# Patient Record
Sex: Male | Born: 1975 | Race: White | Hispanic: No | Marital: Married | Smoking: Current every day smoker
Health system: Southern US, Community
[De-identification: ages and names within clinical notes are randomized; demographics above are authoritative.]

## PROBLEM LIST (undated history)

## (undated) ENCOUNTER — Emergency Department: Discharge status: Absent without leave | Payer: Self-pay

## (undated) DIAGNOSIS — M549 Dorsalgia, unspecified: Secondary | ICD-10-CM

## (undated) DIAGNOSIS — G8929 Other chronic pain: Secondary | ICD-10-CM

## (undated) HISTORY — PX: TONSILLECTOMY: SUR1361

---

## 2009-11-15 ENCOUNTER — Emergency Department (HOSPITAL_COMMUNITY): Admission: EM | Admit: 2009-11-15 | Discharge: 2009-11-15 | Payer: Self-pay | Admitting: Emergency Medicine

## 2016-09-19 ENCOUNTER — Emergency Department (HOSPITAL_COMMUNITY)
Admission: EM | Admit: 2016-09-19 | Discharge: 2016-09-19 | Disposition: A | Discharge status: Home / Self Care | Payer: Self-pay | Attending: Emergency Medicine | Admitting: Emergency Medicine

## 2016-09-19 ENCOUNTER — Emergency Department (HOSPITAL_COMMUNITY): Payer: Self-pay

## 2016-09-19 ENCOUNTER — Encounter (HOSPITAL_COMMUNITY): Payer: Self-pay | Admitting: *Deleted

## 2016-09-19 DIAGNOSIS — M5442 Lumbago with sciatica, left side: Secondary | ICD-10-CM | POA: Insufficient documentation

## 2016-09-19 DIAGNOSIS — F17210 Nicotine dependence, cigarettes, uncomplicated: Secondary | ICD-10-CM | POA: Insufficient documentation

## 2016-09-19 HISTORY — DX: Dorsalgia, unspecified: M54.9

## 2016-09-19 HISTORY — DX: Other chronic pain: G89.29

## 2016-09-19 MED ORDER — OXYCODONE-ACETAMINOPHEN 5-325 MG PO TABS: 1.0000 | ORAL_TABLET | ORAL | 0 refills | Status: DC | PRN

## 2016-09-19 MED ORDER — NAPROXEN 500 MG PO TABS: 500.0000 mg | ORAL_TABLET | Freq: Two times a day (BID) | ORAL | 0 refills | Status: DC

## 2016-09-19 MED ORDER — PREDNISONE 10 MG PO TABS
20.0000 mg | ORAL_TABLET | Freq: Every day | ORAL | 0 refills | Status: DC
Start: 2016-09-19 — End: 2016-12-11

## 2016-09-19 MED ORDER — HYDROMORPHONE HCL 1 MG/ML IJ SOLN
1.0000 mg | Freq: Once | INTRAMUSCULAR | Status: AC
  Administered 2016-09-19: 1 mg via INTRAMUSCULAR
  Filled 2016-09-19: qty 1

## 2016-09-19 NOTE — ED Notes (Signed)
Patient transported to X-ray 

## 2016-09-19 NOTE — ED Provider Notes (Signed)
[facility]-EM[facility]Y DEPT Provider Note   CSN: [id number] Arrival date & time: 09/19/16  0901  By signing my name below, I, Bing Neighbors., attest that this documentation has been prepared under the direction and in the presence of No att. providers found. Electronically signed: Bing Neighbors., ED Scribe. 09/19/16. 2:58 PM.   History   Chief Complaint Chief Complaint  Patient presents with  . Back Pain    HPI BOWMAN HIGBIE is a 41 y.o. male who presents to the Emergency Department complaining of mild L lower back pain with onset x2 months. Pt states that he has had L lower back pain that radiates down the L leg to the heel for the past x2 months. He describes the pain as cramping. Pt denies any modifying factors. Pt states that lying down and position changes exacerbate the pain. Pt denies any recent heavy lifting. Of note, pt states that x2 months ago while chopping wood, he injured his R leg causing him to favor his L leg. He states that he has had gradually worsening L lower back pain since this incident.   The history is provided by the patient. No language interpreter was used.    Past Medical History:  Diagnosis Date  . Chronic back pain     There are no active problems to display for this patient.   Past Surgical History:  Procedure Laterality Date  . TONSILLECTOMY         Home Medications    Prior to Admission medications   Medication Sig Start Date End Date Taking? Authorizing Provider  Aspirin-Acetaminophen-Caffeine (GOODY HEADACHE PO) Take 1 packet by mouth daily as needed (pain).   Yes Historical Provider, MD  naproxen (NAPROSYN) 500 MG tablet Take 1 tablet (500 mg total) by mouth 2 (two) times daily. 09/19/16   Donnetta Hutching, MD  oxyCODONE-acetaminophen (PERCOCET) 5-325 MG tablet Take 1-2 tablets by mouth every 4 (four) hours as needed. 09/19/16   Donnetta Hutching, MD  predniSONE (DELTASONE) 10 MG tablet Take 2 tablets (20 mg total) by mouth daily.  09/19/16   Donnetta Hutching, MD    Family History History reviewed. No pertinent family history.  Social History Social History  Substance Use Topics  . Smoking status: Current Every Day Smoker    Packs/day: 1.00    Types: Cigarettes  . Smokeless tobacco: Never Used  . Alcohol use Yes     Comment: occasionally     Allergies   Patient has no known allergies.   Review of Systems Review of Systems  All other systems reviewed and are negative.    Physical Exam Updated Vital Signs BP 130/70   Pulse (!) 53   Temp 97.8 F (36.6 C) (Oral)   Resp 18   Ht  (1.803 m)   Wt 162 lb (73.5 kg)   SpO2 100%   BMI 22.59 kg/m   Physical Exam  Constitutional: He is oriented to person, place, and time. He appears well-developed and well-nourished.  HENT:  Head: Normocephalic and atraumatic.  Eyes: Conjunctivae are normal.  Neck: Neck supple.  Cardiovascular: Normal rate and regular rhythm.   Pulmonary/Chest: Effort normal and breath sounds normal.  Abdominal: Soft. Bowel sounds are normal.  Musculoskeletal: Normal range of motion.       Lumbar back: He exhibits tenderness.  Neurological: He is alert and oriented to person, place, and time.  Skin: Skin is warm and dry.  Psychiatric: He has a normal mood and affect. His  behavior is normal.  Nursing note and vitals reviewed.    ED Treatments / Results   DIAGNOSTIC STUDIES: Oxygen Saturation is 100% on RA, normal by my interpretation.   COORDINATION OF CARE: 2:58 PM-Discussed next steps with pt. Pt verbalized understanding and is agreeable with the plan.    Labs (all labs ordered are listed, but only abnormal results are displayed) Labs Reviewed - No data to display  EKG  EKG Interpretation None       Radiology Dg Lumbar Spine Complete  Result Date: 09/19/2016 CLINICAL DATA:  Low back pain extending into the left lower extremity. Pain began while cutting wood 3 months ago. EXAM: LUMBAR SPINE - COMPLETE 4+ VIEW  COMPARISON:  None. FINDINGS: Five non rib-bearing lumbar type vertebral bodies are present. The vertebral body heights and alignment are normal. Soft tissues are within normal limits. IMPRESSION: Negative lumbar spine radiographs. Electronically Signed   By: Marin Roberts M.D.   On: 09/19/2016 10:39    Procedures Procedures (including critical care time)  Medications Ordered in ED Medications  HYDROmorphone (DILAUDID) injection 1 mg (1 mg Intramuscular Given 09/19/16 1016)     Initial Impression / Assessment and Plan / ED Course  I have reviewed the triage vital signs and the nursing notes.  The plan is the get an X-ray of the lumbar spine and prescribe pt prednisone for pain management. If this continues pt will need an MRI.  Pertinent labs & imaging results that were available during my care of the patient were reviewed by me and considered in my medical decision making (see chart for details).     History and physical most consistent with left-sided sciatica pain.  We discussed the possibility of a herniated nucleus pulposus and the need for an MRI in the future [if symptoms do not improve].  Discharge medications prednisone, Percocet, Naprosyn 500 mg  Final Clinical Impressions(s) / ED Diagnoses   Final diagnoses:  Acute left-sided low back pain with left-sided sciatica    New Prescriptions Discharge Medication List as of 09/19/2016 12:59 PM    START taking these medications   Details  naproxen (NAPROSYN) 500 MG tablet Take 1 tablet (500 mg total) by mouth 2 (two) times daily., Starting Fri 09/19/2016, Print    oxyCODONE-acetaminophen (PERCOCET) 5-325 MG tablet Take 1-2 tablets by mouth every 4 (four) hours as needed., Starting Fri 09/19/2016, Print    predniSONE (DELTASONE) 10 MG tablet Take 2 tablets (20 mg total) by mouth daily., Starting Fri 09/19/2016, Print       I personally performed the services described in this documentation, which was scribed in my  presence. The recorded information has been reviewed and is accurate.      Donnetta Hutching, MD 09/19/16 1459

## 2016-09-19 NOTE — ED Notes (Signed)
Patient with no complaints at this time. Respirations even and unlabored. Skin warm/dry. Discharge instructions reviewed with patient at this time. Patient given opportunity to voice concerns/ask questions. Patient discharged at this time and left Emergency Department with steady gait.   

## 2016-09-19 NOTE — ED Notes (Signed)
Patient returned from xray.

## 2016-09-19 NOTE — ED Triage Notes (Signed)
Pt c/o lower back pain for the last week; pt states he has numbness and tingling down left leg; pt denies any recent injury, states he hurt it in the past

## 2016-09-19 NOTE — Discharge Instructions (Signed)
X-ray shows no obvious fractures. If symptoms persist, you will need an MRI of your lower spine. Prescription for anti-inflammatory medicine, pain medicine, prednisone. Try to find a primary care doctor.

## 2016-09-28 ENCOUNTER — Encounter (HOSPITAL_COMMUNITY): Payer: Self-pay | Admitting: *Deleted

## 2016-09-28 ENCOUNTER — Emergency Department (HOSPITAL_COMMUNITY)
Admission: EM | Admit: 2016-09-28 | Discharge: 2016-09-28 | Disposition: A | Discharge status: Home / Self Care | Payer: Self-pay | Attending: Emergency Medicine | Admitting: Emergency Medicine

## 2016-09-28 DIAGNOSIS — Z7982 Long term (current) use of aspirin: Secondary | ICD-10-CM | POA: Insufficient documentation

## 2016-09-28 DIAGNOSIS — M5442 Lumbago with sciatica, left side: Secondary | ICD-10-CM | POA: Insufficient documentation

## 2016-09-28 DIAGNOSIS — F17210 Nicotine dependence, cigarettes, uncomplicated: Secondary | ICD-10-CM | POA: Insufficient documentation

## 2016-09-28 DIAGNOSIS — M5432 Sciatica, left side: Secondary | ICD-10-CM

## 2016-09-28 MED ORDER — ONDANSETRON 8 MG PO TBDP
8.0000 mg | ORAL_TABLET | Freq: Once | ORAL | Status: AC
  Administered 2016-09-28: 8 mg via ORAL
  Filled 2016-09-28: qty 1

## 2016-09-28 MED ORDER — CYCLOBENZAPRINE HCL 5 MG PO TABS: 5.0000 mg | ORAL_TABLET | Freq: Three times a day (TID) | ORAL | 0 refills | Status: DC | PRN

## 2016-09-28 MED ORDER — OXYCODONE-ACETAMINOPHEN 5-325 MG PO TABS: 1.0000 | ORAL_TABLET | ORAL | 0 refills | Status: DC | PRN

## 2016-09-28 MED ORDER — DICLOFENAC SODIUM 75 MG PO TBEC: 75.0000 mg | DELAYED_RELEASE_TABLET | Freq: Two times a day (BID) | ORAL | 0 refills | Status: DC

## 2016-09-28 MED ORDER — HYDROMORPHONE HCL 1 MG/ML IJ SOLN
1.0000 mg | Freq: Once | INTRAMUSCULAR | Status: AC
  Administered 2016-09-28: 1 mg via INTRAMUSCULAR
  Filled 2016-09-28: qty 1

## 2016-09-28 NOTE — ED Triage Notes (Signed)
Pt comes in with lower back pain. Pt was seen here for this last week. Pain continues and moves down his left leg.

## 2016-09-28 NOTE — ED Provider Notes (Signed)
[facility] DE[facility] Provider Note   CSN: [id number] Arrival date & time: 09/28/16  1502  By signing my name below, I, Diona Browner, attest that this documentation has been prepared under the direction and in the presence of Burgess Amor, PA-C. Electronically Signed: Diona Browner, ED Scribe. 09/28/16. 3:59 PM.  History   Chief Complaint Chief Complaint  Patient presents with  . Back Pain    HPI Ricardo Graves is a 41 y.o. male with a PMHx of chronic back pain, who presents to the Emergency Department complaining of gradually worsening, radiating lower back pain for the last two months. Pt reports pain radiates down his left leg to his heel. Was seen on 09/19/16 for similar onset and was given medication that ran out yesterday. He has an appointment with a neurology specialist next week. When he wakes up it takes him ~ 30 minutes to fully stand up, and then he is able to walk around. Moving around alleviates his pain, while sitting exacerbates his pain. 7 years ago he originally hurt his back while moving a tv and then the beginning of the year he reinjured his back while cutting wood. Pt denies weakness in his legs and any urinary issues.   The history is provided by the patient. No language interpreter was used.    Past Medical History:  Diagnosis Date  . Chronic back pain     There are no active problems to display for this patient.   Past Surgical History:  Procedure Laterality Date  . TONSILLECTOMY         Home Medications    Prior to Admission medications   Medication Sig Start Date End Date Taking? Authorizing Provider  Aspirin-Acetaminophen-Caffeine (GOODY HEADACHE PO) Take 1 packet by mouth daily as needed (pain).    Historical Provider, MD  cyclobenzaprine (FLEXERIL) 5 MG tablet Take 1 tablet (5 mg total) by mouth 3 (three) times daily as needed for muscle spasms. 09/28/16   Burgess Amor, PA-C  diclofenac (VOLTAREN) 75 MG EC tablet Take 1 tablet (75 mg total) by  mouth 2 (two) times daily. 09/28/16   Burgess Amor, PA-C  naproxen (NAPROSYN) 500 MG tablet Take 1 tablet (500 mg total) by mouth 2 (two) times daily. 09/19/16   Donnetta Hutching, MD  oxyCODONE-acetaminophen (PERCOCET/ROXICET) 5-325 MG tablet Take 1 tablet by mouth every 4 (four) hours as needed. 09/28/16   Burgess Amor, PA-C  predniSONE (DELTASONE) 10 MG tablet Take 2 tablets (20 mg total) by mouth daily. 09/19/16   Donnetta Hutching, MD    Family History No family history on file.  Social History Social History  Substance Use Topics  . Smoking status: Current Every Day Smoker    Packs/day: 1.00    Types: Cigarettes  . Smokeless tobacco: Never Used  . Alcohol use Yes     Comment: occasionally     Allergies   Patient has no known allergies.   Review of Systems Review of Systems  Genitourinary: Negative for difficulty urinating, dysuria, frequency, hematuria and urgency.  Musculoskeletal: Positive for back pain.  Neurological: Negative for weakness.     Physical Exam Updated Vital Signs BP (!) 148/100 (BP Location: Right Arm)   Pulse 88   Temp 97.4 F (36.3 C) (Temporal)   Resp 18   Ht  (1.803 m)   Wt 73.5 kg   SpO2 100%   BMI 22.59 kg/m   Physical Exam  Constitutional: He appears well-developed and well-nourished.  HENT:  Head: Normocephalic.  Eyes: Conjunctivae are normal.  Neck: Normal range of motion. Neck supple.  Cardiovascular: Normal rate and intact distal pulses.   Pedal pulses normal.  Pulmonary/Chest: Effort normal.  Abdominal: Soft. Bowel sounds are normal. He exhibits no distension and no mass.  Musculoskeletal: Normal range of motion. He exhibits no edema.       Lumbar back: He exhibits tenderness. He exhibits no swelling, no edema and no spasm.  Pain present across lower left paralumbar soft tissue, not worsened with palpation.  Sciatic notch ttp.  Neurological: He is alert. He has normal strength. He displays no atrophy and no tremor. No sensory deficit.  Gait normal.  Reflex Scores:      Patellar reflexes are 2+ on the right side and 2+ on the left side.      Achilles reflexes are 2+ on the right side and 2+ on the left side. No strength deficit noted in hip and knee flexor and extensor muscle groups.  Ankle flexion and extension intact.  Skin: Skin is warm and dry.  Psychiatric: He has a normal mood and affect.  Nursing note and vitals reviewed.    ED Treatments / Results  DIAGNOSTIC STUDIES: Oxygen Saturation is 100% on RA, normal by my interpretation.   COORDINATION OF CARE: 3:59 PM-Discussed next steps with pt. Pt verbalized understanding and is agreeable with the plan.    Labs (all labs ordered are listed, but only abnormal results are displayed) Labs Reviewed - No data to display  EKG  EKG Interpretation None       Radiology No results found.  Procedures Procedures (including critical care time)  Medications Ordered in ED Medications  HYDROmorphone (DILAUDID) injection 1 mg (1 mg Intramuscular Given 09/28/16 1633)  ondansetron (ZOFRAN-ODT) disintegrating tablet 8 mg (8 mg Oral Given 09/28/16 1633)     Initial Impression / Assessment and Plan / ED Course  I have reviewed the triage vital signs and the nursing notes.  Pertinent labs & imaging results that were available during my care of the patient were reviewed by me and considered in my medical decision making (see chart for details).     No neuro deficit on exam or by history to suggest emergent or surgical presentation.  Also discussed worsened sx that should prompt immediate re-evaluation including distal weakness, bowel/bladder retention/incontinence.  Pt has appt with neurologist next week, encouraged to keep this appt. Also given referral for primary care.   Final Clinical Impressions(s) / ED Diagnoses   Final diagnoses:  Sciatica of left side    New Prescriptions New Prescriptions   CYCLOBENZAPRINE (FLEXERIL) 5 MG TABLET    Take 1 tablet (5  mg total) by mouth 3 (three) times daily as needed for muscle spasms.   DICLOFENAC (VOLTAREN) 75 MG EC TABLET    Take 1 tablet (75 mg total) by mouth 2 (two) times daily.   OXYCODONE-ACETAMINOPHEN (PERCOCET/ROXICET) 5-325 MG TABLET    Take 1 tablet by mouth every 4 (four) hours as needed.   I personally performed the services described in this documentation, which was scribed in my presence. The recorded information has been reviewed and is accurate.     Burgess Amor, PA-C 09/28/16 1658    Raeford Razor, MD 09/28/16 2329

## 2016-09-28 NOTE — Discharge Instructions (Signed)
Do not drive within 4 hours of taking oxycodone as this will make you drowsy.  Avoid lifting,  Bending,  Twisting or any other activity that worsens your pain over the next week.  Apply a heating pad to your back 20 minutes 2-3 times daily.  You should get rechecked if your symptoms are not improving or you develop increased pain,  Weakness in your leg(s) or loss of bladder or bowel function - these are symptoms of a worsening condition.

## 2016-09-29 DIAGNOSIS — Z139 Encounter for screening, unspecified: Secondary | ICD-10-CM

## 2016-09-29 LAB — GLUCOSE, POCT (MANUAL RESULT ENTRY): POC GLUCOSE: 102 mg/dL — AB (ref 70–99)

## 2016-09-29 NOTE — Congregational Nurse Program (Signed)
Congregational Nurse Program Note  Date of Encounter: 09/29/2016  Past Medical History: Past Medical History:  Diagnosis Date  . Chronic back pain     Encounter Details:     CNP Questionnaire - 09/29/16 1423      Patient Demographics   Is this a new or existing patient? New   Patient is considered a/an Not Applicable   Race Caucasian/White     Patient Assistance   Location of Patient Assistance [facility]   Patient's financial/insurance status Self-Pay (Uninsured)   Uninsured Patient ([place] [employer]/Care Connects) Yes   Interventions Counseled to make appt. with provider   Patient referred to apply for the following financial assistance [employer] or to a Navigator   Food insecurities addressed Not Applicable   Transportation assistance No   Assistance securing medications No   Educational health offerings Acute disease;Navigating the healthcare system;Safety     Encounter Details   Primary purpose of visit Education/Health Concerns;Acute Illness/Condition Visit;Navigating the Healthcare System;Safety;Post ED/Hospitalization Visit   Was an Emergency Department visit averted? Not Applicable   Does patient have a medical provider? No   Patient referred to Establish PCP;Private Practice   Was a mental health screening completed? (GAINS tool) No   Does patient have dental issues? No   Does patient have vision issues? No   Does your patient have an abnormal blood pressure today? No   Since previous encounter, have you referred patient for abnormal blood pressure that resulted in a new diagnosis or medication change? No   Does your patient have an abnormal blood glucose today? No   Since previous encounter, have you referred patient for abnormal blood glucose that resulted in a new diagnosis or medication change? No   Was there a life-saving intervention made? No     Client seen today after being seen in [facility] Emergency Room on 09/28/16 for a second time due to lower  back pain that radiates down his left leg with some tingling. He states her injured it this time in February cutting wood and that it has not gotten any better. He first injured his lower back per client about 7 years ago while moving a heavy TV set. Client states he has had plain x-rays in a previous Emergency room visit and states they referred him to get established with a medical provider. Client lives with family and works full time with [employer] and states he has worked with them the past few years. He shares that he intends on getting medical insurance next enrollment period in November and it will start in January, but he just hadn't done so in the past. Discussed options of "safety net providers" and that he would have do have financial screenings with those and what the guidelines would be. Client states he probably would make too much money.  Discussed option of a private local medical practice that is accepting new patients. Client would prefer to stay within [place] for his medical care. Client given contact information as well as a new patient application for [facility]. Discussed with client that establishing a primary medical provider is important to help guide his care and to use for wellness and preventative care as well as non emergent medical care. Client states understanding and states he will go by there. RN discussed that he will incur a copayment that would be paid out of pocket, but once established and he has medical insurance he would still be able to continue with that  practice more than likely depending on his insurance coverage. Client states he is willing to pay out of pocket at this time for help with his pain.  Alert and oriented to person and place, answers questions appropriately. Client describes pain worse with sitting to standing and also while lying down. Gait steady , denies any loss of strength in lower left leg. States he does have tingling  in his left leg off and on and some over left sciatic area. Denies any incontinence of bowel or bladder and denies trouble urinating. Client states he was going to call a neurologist in [place] but has not done so yet. He states he asked the hospital for an MRI but was told they could not order unless an emergency. Medications given in Emergency room to take as outpatient : Diclofenac one twice daily Cyclobenzaprine one 3 times daily Oxycondone 1 by mouth every 4 hours as needed for pain  Client reports that with taking these medications he is not taking "Goody Powders" RN discussed with client to take his medications with food or after eating a meal and to avoid any extra tylenol or NSAIDS such as ibuprofen while taking above medications. RN discussed techniques to ease back pain while in bed such as lying on side with one leg flexed and pillow under to support lower back, also discussed changing positions from lying to standing gradually as well as avoiding twisting and bending from waist without support. Client states understanding. Client states he will go by to inquire about becoming a medical patient at [facility]. RN gave Hyman Bower contact information and will follow up with client in  1 to 2 days to follow up if he connected with a medical provider. Clinic verbally acknowledged permission to call for follow up

## 2016-09-30 ENCOUNTER — Telehealth: Payer: Self-pay

## 2016-09-30 NOTE — Telephone Encounter (Signed)
Patient was here at [facility] 09/29/16 with pain. Upon intake it was discovered that he work full time at [employer].  Patient was encouraged to call the [facility] for new patient intake. Was also given a list of other MDs taking new patients.  Follow-up call was made to check on patient. Message was left to call Hyman Bower if he needed any more assistance.  Acsa Estey R. Tiran Sauseda LPN [phone]

## 2016-10-17 ENCOUNTER — Other Ambulatory Visit (HOSPITAL_COMMUNITY): Payer: Self-pay | Admitting: Internal Medicine

## 2016-10-17 DIAGNOSIS — M5432 Sciatica, left side: Secondary | ICD-10-CM

## 2016-10-29 ENCOUNTER — Ambulatory Visit (HOSPITAL_COMMUNITY)
Admission: RE | Admit: 2016-10-29 | Discharge: 2016-10-29 | Disposition: A | Discharge status: Home / Self Care | Payer: Medicaid Other | Source: Ambulatory Visit | Attending: Internal Medicine | Admitting: Internal Medicine

## 2016-10-29 DIAGNOSIS — M5127 Other intervertebral disc displacement, lumbosacral region: Secondary | ICD-10-CM | POA: Diagnosis not present

## 2016-10-29 DIAGNOSIS — M5432 Sciatica, left side: Secondary | ICD-10-CM

## 2016-12-02 ENCOUNTER — Other Ambulatory Visit: Payer: Self-pay | Admitting: Nurse Practitioner

## 2016-12-02 DIAGNOSIS — M5126 Other intervertebral disc displacement, lumbar region: Secondary | ICD-10-CM

## 2016-12-11 ENCOUNTER — Ambulatory Visit
Admission: RE | Admit: 2016-12-11 | Discharge: 2016-12-11 | Disposition: A | Discharge status: Home / Self Care | Payer: Self-pay | Source: Ambulatory Visit | Attending: Nurse Practitioner | Admitting: Nurse Practitioner

## 2016-12-11 DIAGNOSIS — M5126 Other intervertebral disc displacement, lumbar region: Secondary | ICD-10-CM

## 2016-12-11 MED ORDER — IOPAMIDOL (ISOVUE-M 200) INJECTION 41%
1.0000 mL | Freq: Once | INTRAMUSCULAR | Status: AC
  Administered 2016-12-11: 1 mL via EPIDURAL

## 2016-12-11 MED ORDER — METHYLPREDNISOLONE ACETATE 40 MG/ML INJ SUSP (RADIOLOG
120.0000 mg | Freq: Once | INTRAMUSCULAR | Status: AC
  Administered 2016-12-11: 120 mg via EPIDURAL

## 2016-12-11 NOTE — Discharge Instructions (Signed)

## 2016-12-19 ENCOUNTER — Other Ambulatory Visit: Payer: Self-pay | Admitting: Neurosurgery

## 2016-12-19 DIAGNOSIS — M5126 Other intervertebral disc displacement, lumbar region: Secondary | ICD-10-CM

## 2016-12-30 ENCOUNTER — Other Ambulatory Visit: Payer: Self-pay | Admitting: Neurosurgery

## 2016-12-30 ENCOUNTER — Ambulatory Visit
Admission: RE | Admit: 2016-12-30 | Discharge: 2016-12-30 | Disposition: A | Discharge status: Home / Self Care | Payer: Self-pay | Source: Ambulatory Visit | Attending: Neurosurgery | Admitting: Neurosurgery

## 2016-12-30 DIAGNOSIS — M5126 Other intervertebral disc displacement, lumbar region: Secondary | ICD-10-CM

## 2016-12-30 MED ORDER — METHYLPREDNISOLONE ACETATE 40 MG/ML INJ SUSP (RADIOLOG
120.0000 mg | Freq: Once | INTRAMUSCULAR | Status: AC
  Administered 2016-12-30: 120 mg via EPIDURAL

## 2016-12-30 MED ORDER — IOPAMIDOL (ISOVUE-M 200) INJECTION 41%
1.0000 mL | Freq: Once | INTRAMUSCULAR | Status: AC
  Administered 2016-12-30: 1 mL via EPIDURAL

## 2016-12-30 NOTE — Discharge Instructions (Signed)

## 2017-01-23 ENCOUNTER — Other Ambulatory Visit: Payer: Self-pay | Admitting: Nurse Practitioner

## 2017-01-23 DIAGNOSIS — M5126 Other intervertebral disc displacement, lumbar region: Secondary | ICD-10-CM

## 2017-02-04 ENCOUNTER — Ambulatory Visit
Admission: RE | Admit: 2017-02-04 | Discharge: 2017-02-04 | Disposition: A | Discharge status: Home / Self Care | Payer: Medicaid Other | Source: Ambulatory Visit | Attending: Nurse Practitioner | Admitting: Nurse Practitioner

## 2017-02-04 DIAGNOSIS — M5126 Other intervertebral disc displacement, lumbar region: Secondary | ICD-10-CM

## 2017-02-04 MED ORDER — METHYLPREDNISOLONE ACETATE 40 MG/ML INJ SUSP (RADIOLOG
120.0000 mg | Freq: Once | INTRAMUSCULAR | Status: AC
  Administered 2017-02-04: 120 mg via EPIDURAL

## 2017-02-04 MED ORDER — IOPAMIDOL (ISOVUE-M 200) INJECTION 41%
1.0000 mL | Freq: Once | INTRAMUSCULAR | Status: AC
  Administered 2017-02-04: 1 mL via EPIDURAL

## 2017-02-04 NOTE — Discharge Instructions (Signed)

## 2018-01-25 ENCOUNTER — Emergency Department (HOSPITAL_COMMUNITY)
Admission: EM | Admit: 2018-01-25 | Discharge: 2018-01-25 | Disposition: A | Discharge status: Home / Self Care | Payer: Medicaid Other | Attending: Emergency Medicine | Admitting: Emergency Medicine

## 2018-01-25 ENCOUNTER — Encounter (HOSPITAL_COMMUNITY): Payer: Self-pay | Admitting: Emergency Medicine

## 2018-01-25 DIAGNOSIS — Y999 Unspecified external cause status: Secondary | ICD-10-CM | POA: Insufficient documentation

## 2018-01-25 DIAGNOSIS — Z7982 Long term (current) use of aspirin: Secondary | ICD-10-CM | POA: Insufficient documentation

## 2018-01-25 DIAGNOSIS — Y939 Activity, unspecified: Secondary | ICD-10-CM | POA: Diagnosis not present

## 2018-01-25 DIAGNOSIS — T23291A Burn of second degree of multiple sites of right wrist and hand, initial encounter: Secondary | ICD-10-CM | POA: Diagnosis not present

## 2018-01-25 DIAGNOSIS — T22211A Burn of second degree of right forearm, initial encounter: Secondary | ICD-10-CM | POA: Insufficient documentation

## 2018-01-25 DIAGNOSIS — T2220XA Burn of second degree of shoulder and upper limb, except wrist and hand, unspecified site, initial encounter: Secondary | ICD-10-CM

## 2018-01-25 DIAGNOSIS — Y929 Unspecified place or not applicable: Secondary | ICD-10-CM | POA: Insufficient documentation

## 2018-01-25 DIAGNOSIS — F17210 Nicotine dependence, cigarettes, uncomplicated: Secondary | ICD-10-CM | POA: Insufficient documentation

## 2018-01-25 DIAGNOSIS — X17XXXA Contact with hot engines, machinery and tools, initial encounter: Secondary | ICD-10-CM | POA: Diagnosis not present

## 2018-01-25 DIAGNOSIS — T23201A Burn of second degree of right hand, unspecified site, initial encounter: Secondary | ICD-10-CM

## 2018-01-25 MED ORDER — IBUPROFEN 800 MG PO TABS
800.0000 mg | ORAL_TABLET | Freq: Once | ORAL | Status: AC
  Administered 2018-01-25: 800 mg via ORAL
  Filled 2018-01-25: qty 1

## 2018-01-25 MED ORDER — SILVER SULFADIAZINE 1 % EX CREA
TOPICAL_CREAM | Freq: Once | CUTANEOUS | Status: AC
  Administered 2018-01-25: 1 via TOPICAL
  Filled 2018-01-25: qty 50

## 2018-01-25 MED ORDER — SILVER SULFADIAZINE 1 % EX CREA: 1.0000 "application " | TOPICAL_CREAM | Freq: Every day | CUTANEOUS | 0 refills | Status: AC

## 2018-01-25 MED ORDER — CEFDINIR 300 MG PO CAPS: 300.0000 mg | ORAL_CAPSULE | Freq: Two times a day (BID) | ORAL | 0 refills | Status: AC

## 2018-01-25 MED ORDER — TETANUS-DIPHTH-ACELL PERTUSSIS 5-2.5-18.5 LF-MCG/0.5 IM SUSP
0.5000 mL | Freq: Once | INTRAMUSCULAR | Status: AC
  Administered 2018-01-25: 0.5 mL via INTRAMUSCULAR
  Filled 2018-01-25: qty 0.5

## 2018-01-25 MED ORDER — CEPHALEXIN 500 MG PO CAPS
500.0000 mg | ORAL_CAPSULE | Freq: Once | ORAL | Status: AC
  Administered 2018-01-25: 500 mg via ORAL
  Filled 2018-01-25: qty 1

## 2018-01-25 MED ORDER — IBUPROFEN 600 MG PO TABS: 600.0000 mg | ORAL_TABLET | Freq: Four times a day (QID) | ORAL | 0 refills | Status: AC

## 2018-01-25 MED ORDER — ONDANSETRON HCL 4 MG PO TABS
4.0000 mg | ORAL_TABLET | Freq: Once | ORAL | Status: AC
  Administered 2018-01-25: 4 mg via ORAL
  Filled 2018-01-25: qty 1

## 2018-01-25 MED ORDER — ACETAMINOPHEN 500 MG PO TABS
1000.0000 mg | ORAL_TABLET | Freq: Once | ORAL | Status: AC
  Administered 2018-01-25: 1000 mg via ORAL
  Filled 2018-01-25: qty 2

## 2018-01-25 NOTE — Discharge Instructions (Addendum)
Please cleanse the burn areas with soap and water.  Please do not use any peroxide.  Please code the areas with Silvadene daily.  Use a nonstick dressing to your arm and hand daily.  Please use 600 mg of ibuprofen, and 1000 mg of Tylenol with breakfast, lunch, dinner, and at bedtime for soreness.  Please use Omnicef 2 times daily to prevent infection.  Please see Dr. Selena BattenKim in the office for follow-up of your burn.
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
## 2018-07-26 IMAGING — DX DG LUMBAR SPINE COMPLETE 4+V
5 series · 5 of 5 positions shown · non-contrast
Comparison: None.

CLINICAL DATA: Low back pain extending into the left lower
extremity. Pain began while cutting wood 3 months ago.

EXAM:
LUMBAR SPINE - COMPLETE 4+ VIEW

[l-spine ap]
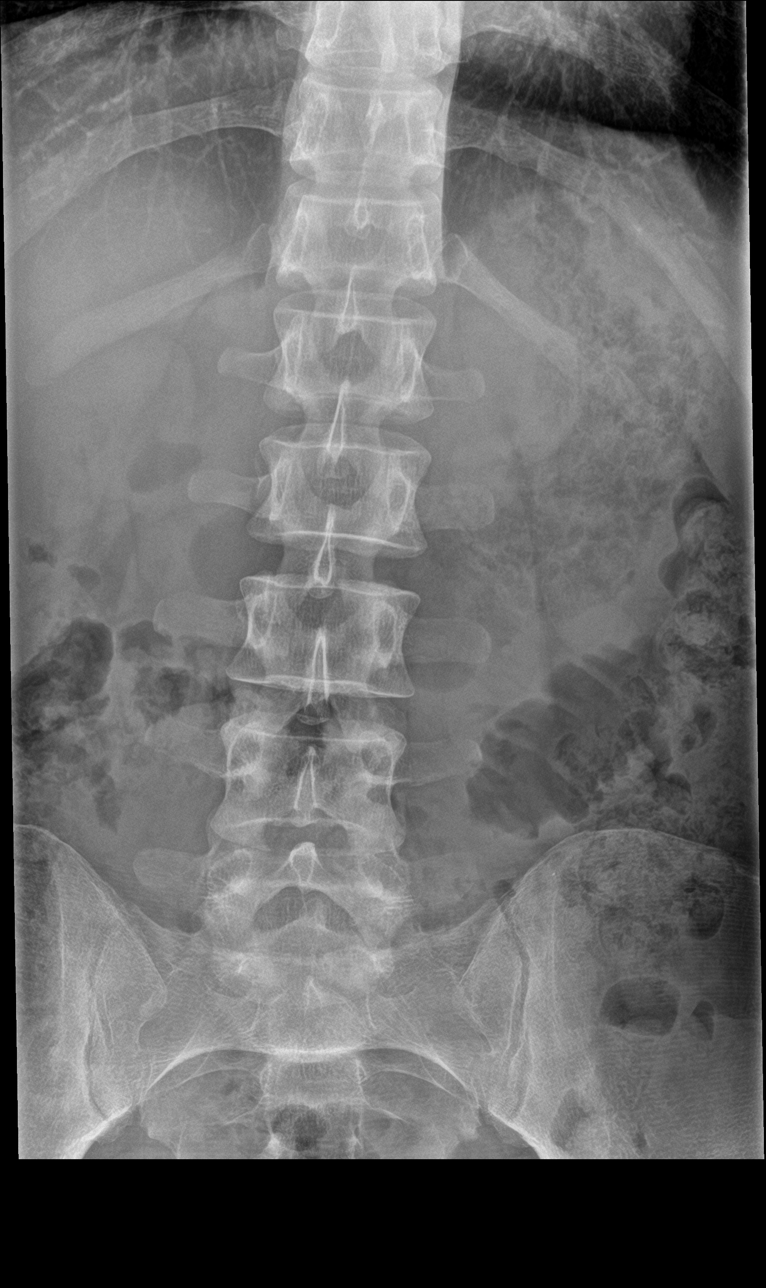

[l-spine obl (1 of 2)]
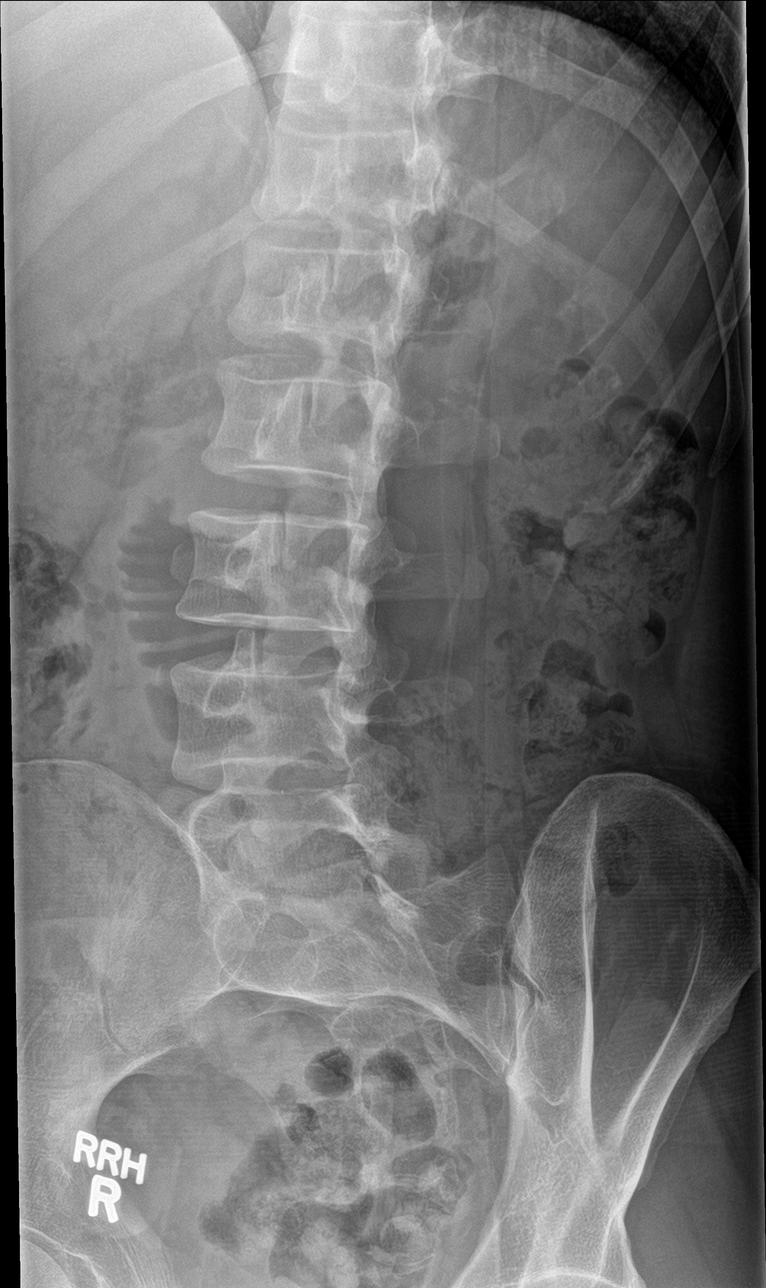

[l-spine obl (2 of 2)]
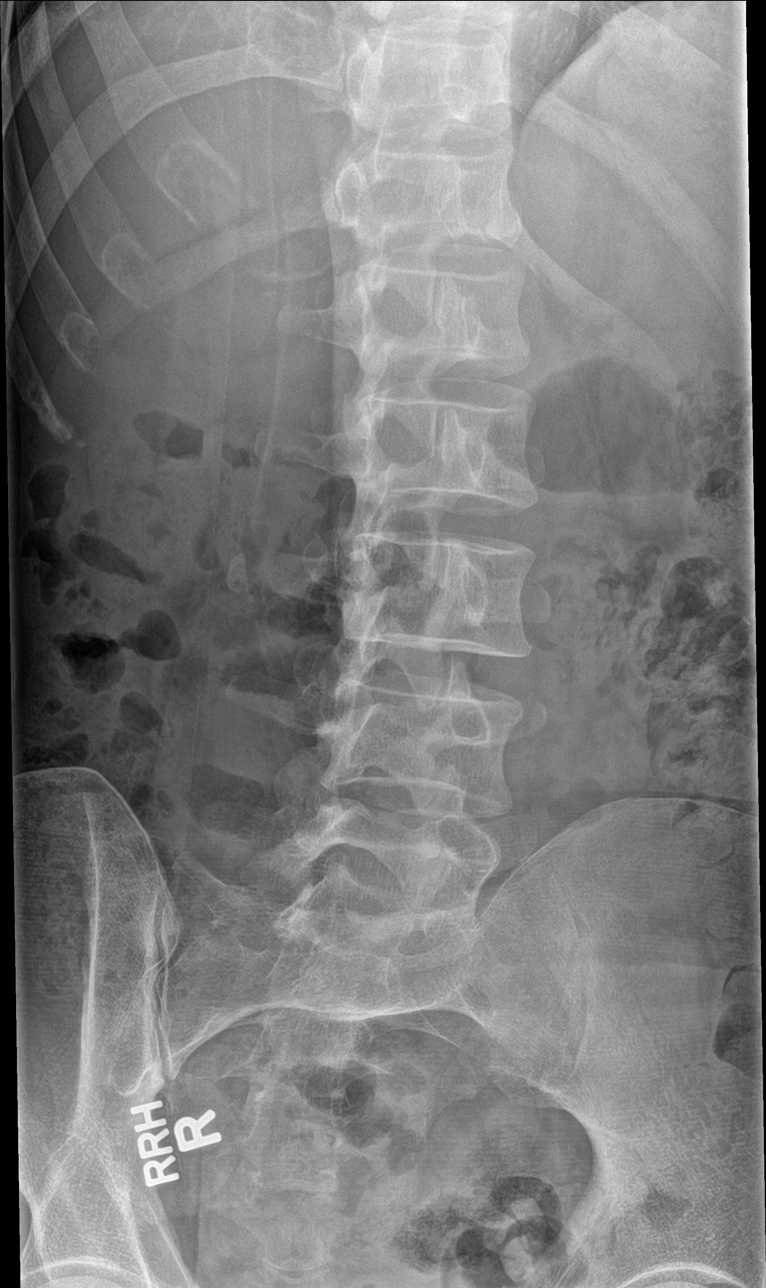

[l-spine lat]
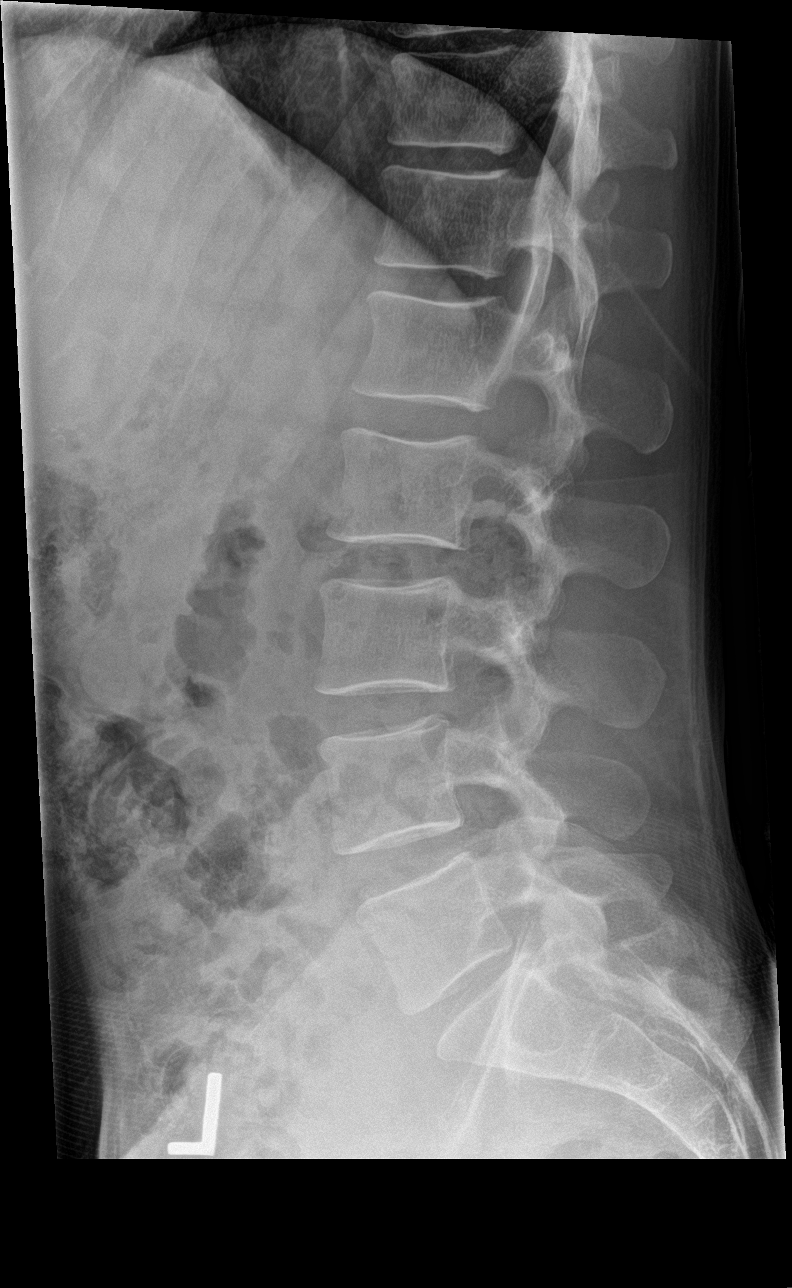

[l-spine spot]
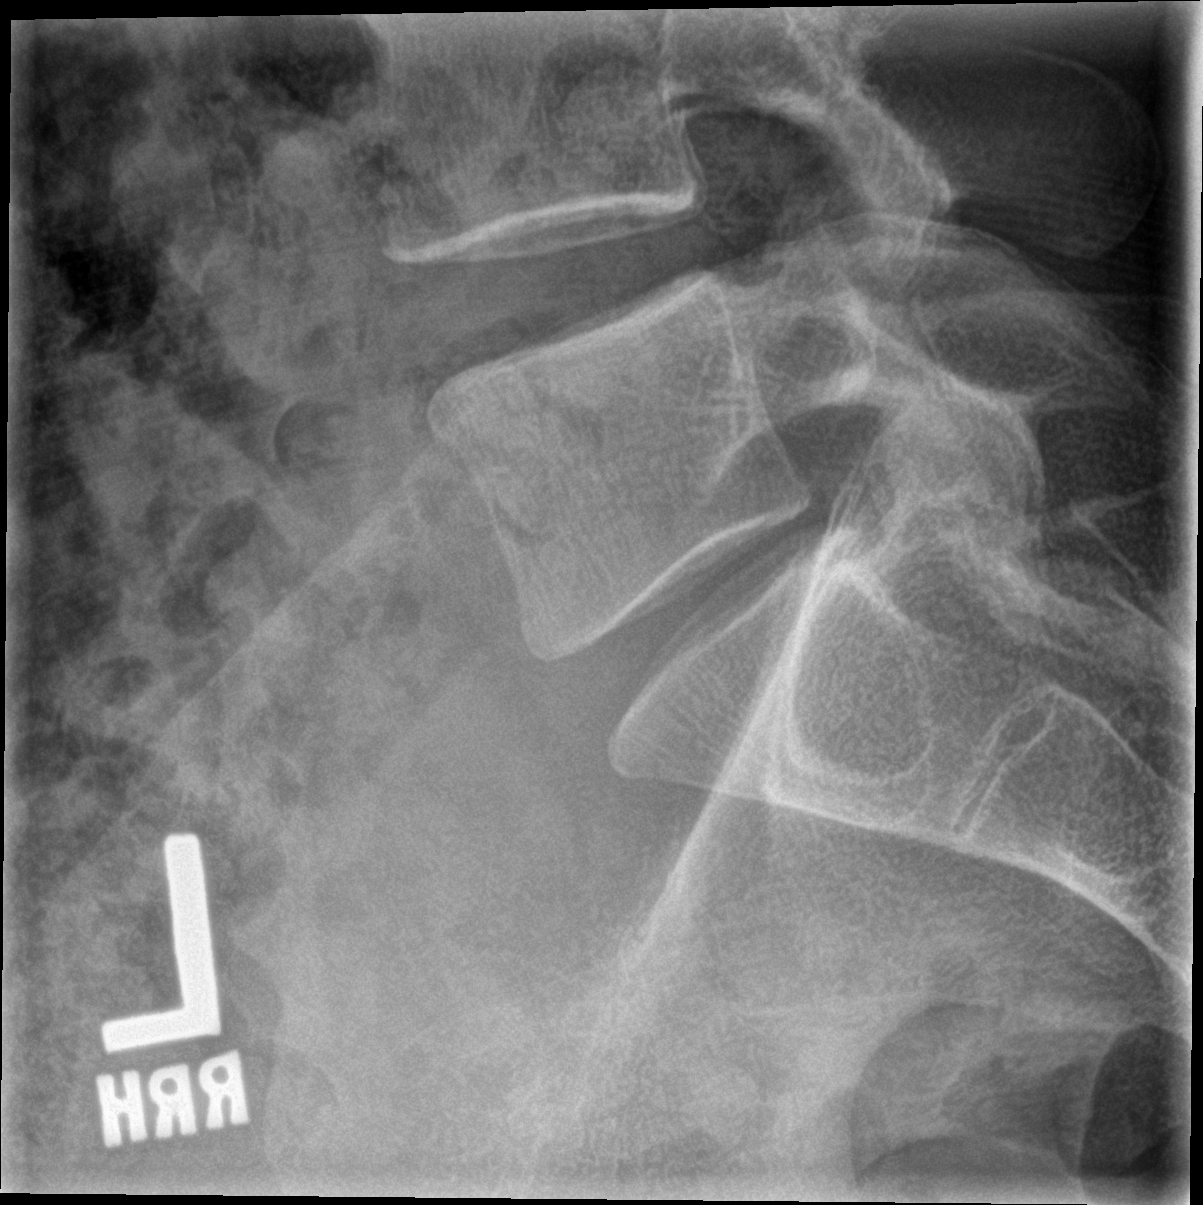

[5 of 5 positions shown; findings below may reference images not displayed]

FINDINGS: Five non rib-bearing lumbar type vertebral bodies are present. The
vertebral body heights and alignment are normal. Soft tissues are
within normal limits.
IMPRESSION: Negative lumbar spine radiographs.

## 2020-05-21 ENCOUNTER — Ambulatory Visit: Payer: Self-pay

## 2020-05-21 ENCOUNTER — Other Ambulatory Visit: Payer: Self-pay | Admitting: Family Medicine

## 2020-05-21 ENCOUNTER — Other Ambulatory Visit: Payer: Self-pay

## 2020-05-21 DIAGNOSIS — Z0000 Encounter for general adult medical examination without abnormal findings: Secondary | ICD-10-CM

## 2022-11-23 ENCOUNTER — Other Ambulatory Visit: Payer: Self-pay

## 2022-11-23 ENCOUNTER — Emergency Department (HOSPITAL_COMMUNITY)
Admission: EM | Admit: 2022-11-23 | Discharge: 2022-11-23 | Disposition: A | Discharge status: Home / Self Care | Payer: Self-pay | Attending: Emergency Medicine | Admitting: Emergency Medicine

## 2022-11-23 ENCOUNTER — Encounter (HOSPITAL_COMMUNITY): Payer: Self-pay | Admitting: *Deleted

## 2022-11-23 DIAGNOSIS — Y900 Blood alcohol level of less than 20 mg/100 ml: Secondary | ICD-10-CM | POA: Insufficient documentation

## 2022-11-23 DIAGNOSIS — F1490 Cocaine use, unspecified, uncomplicated: Secondary | ICD-10-CM

## 2022-11-23 DIAGNOSIS — F1510 Other stimulant abuse, uncomplicated: Secondary | ICD-10-CM | POA: Insufficient documentation

## 2022-11-23 DIAGNOSIS — F1210 Cannabis abuse, uncomplicated: Secondary | ICD-10-CM | POA: Insufficient documentation

## 2022-11-23 DIAGNOSIS — F1410 Cocaine abuse, uncomplicated: Secondary | ICD-10-CM | POA: Insufficient documentation

## 2022-11-23 DIAGNOSIS — R000 Tachycardia, unspecified: Secondary | ICD-10-CM | POA: Insufficient documentation

## 2022-11-23 DIAGNOSIS — F17210 Nicotine dependence, cigarettes, uncomplicated: Secondary | ICD-10-CM | POA: Insufficient documentation

## 2022-11-23 DIAGNOSIS — Z7982 Long term (current) use of aspirin: Secondary | ICD-10-CM | POA: Insufficient documentation

## 2022-11-23 LAB — CBC WITH DIFFERENTIAL/PLATELET
Abs Immature Granulocytes: 0.05 10*3/uL (ref 0.00–0.07)
Basophils Absolute: 0.1 10*3/uL (ref 0.0–0.1)
Basophils Relative: 1 %
Eosinophils Absolute: 0 10*3/uL (ref 0.0–0.5)
Eosinophils Relative: 0 %
HCT: 47.9 % (ref 39.0–52.0)
Hemoglobin: 16.4 g/dL (ref 13.0–17.0)
Immature Granulocytes: 0 %
Lymphocytes Relative: 10 %
Lymphs Abs: 1.3 10*3/uL (ref 0.7–4.0)
MCH: 30 pg (ref 26.0–34.0)
MCHC: 34.2 g/dL (ref 30.0–36.0)
MCV: 87.6 fL (ref 80.0–100.0)
Monocytes Absolute: 0.8 10*3/uL (ref 0.1–1.0)
Monocytes Relative: 6 %
Neutro Abs: 10.9 10*3/uL — ABNORMAL HIGH (ref 1.7–7.7)
Neutrophils Relative %: 83 %
Platelets: 236 10*3/uL (ref 150–400)
RBC: 5.47 MIL/uL (ref 4.22–5.81)
RDW: 12 % (ref 11.5–15.5)
WBC: 13.2 10*3/uL — ABNORMAL HIGH (ref 4.0–10.5)
nRBC: 0 % (ref 0.0–0.2)

## 2022-11-23 LAB — ETHANOL: Alcohol, Ethyl (B): <10 mg/dL (ref <10)

## 2022-11-23 LAB — RAPID URINE DRUG SCREEN, HOSP PERFORMED
Amphetamines: POSITIVE — AB
Barbiturates: NOT DETECTED
Benzodiazepines: NOT DETECTED
Cocaine: POSITIVE — AB
Opiates: NOT DETECTED
Tetrahydrocannabinol: POSITIVE — AB

## 2022-11-23 LAB — COMPREHENSIVE METABOLIC PANEL WITH GFR
ALT: 24 U/L (ref 0–44)
AST: 31 U/L (ref 15–41)
Albumin: 4.9 g/dL (ref 3.5–5.0)
Alkaline Phosphatase: 71 U/L (ref 38–126)
Anion gap: 11 (ref 5–15)
BUN: 18 mg/dL (ref 6–20)
CO2: 24 mmol/L (ref 22–32)
Calcium: 10.1 mg/dL (ref 8.9–10.3)
Chloride: 102 mmol/L (ref 98–111)
Creatinine, Ser: 1.52 mg/dL — ABNORMAL HIGH (ref 0.61–1.24)
GFR, Estimated: 57 mL/min — ABNORMAL LOW (ref >60)
Glucose, Bld: 115 mg/dL — ABNORMAL HIGH (ref 70–99)
Potassium: 3.8 mmol/L (ref 3.5–5.1)
Sodium: 137 mmol/L (ref 135–145)
Total Bilirubin: 1 mg/dL (ref 0.3–1.2)
Total Protein: 7.8 g/dL (ref 6.5–8.1)

## 2022-11-23 LAB — CK: Total CK: 367 U/L (ref 49–397)

## 2022-11-23 LAB — ACETAMINOPHEN LEVEL: Acetaminophen (Tylenol), Serum: <10 ug/mL — ABNORMAL LOW (ref 10–30)

## 2022-11-23 LAB — SALICYLATE LEVEL: Salicylate Lvl: <7 mg/dL — ABNORMAL LOW (ref 7.0–30.0)

## 2022-11-23 NOTE — ED Notes (Signed)
Introduced self to pt Pt rambling and in hand cuffs Pt stated that today is his birthday and he has been celebrating for 2 days for the summer soltice.  Pt stated that he has done a lot of cocaine in his nose Denies any other drug use  Pt denies SI and HI Pt is A&Ox4 although rambling and clearly high  Denies HA, blurred vision and dizziness Denies CP and SOB Denies ABD pain, n/v/d Only complaint is RIGHT wrist pain from hand cuffs.   Informed MD Gave pt urinal for urine sample
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
## 2022-11-23 NOTE — Discharge Instructions (Signed)
You were seen for your substance use in the emergency department.   At home, please stay well-hydrated.    Follow-up with your primary doctor in 2-3 days regarding your visit.  Go to the [facility] behavioral health urgent care if you would like additional resources for your substance use.  Return immediately to the emergency department if you experience any of the following: Chest pains, shortness of breath, severe headaches, or any other concerning symptoms.    Thank you for visiting our Emergency Department. It was a pleasure taking care of you today.
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
## 2022-11-23 NOTE — ED Notes (Signed)
Pt asking to go home  Wife at bedside Wife asking to leave with pt Informed MD

## 2022-11-23 NOTE — ED Triage Notes (Signed)
Pt BIB [facility] after family (wife) called for pt's behavior.  Reported that pt had smoked marijuana prior to be called per [facility].  Pt denies SI.  Pt rambling about God and wife being his true love. Reported that wife took a video of pt's behavior.  Pt noted with abrasion to left and right knee and right lower leg.
